# Patient Record
Sex: Male | Born: 1950 | Race: White | Hispanic: No | Marital: Married | Smoking: Former smoker
Health system: Southern US, Community
[De-identification: ages and names within clinical notes are randomized; demographics above are authoritative.]

---

## 2014-03-22 ENCOUNTER — Other Ambulatory Visit: Payer: Self-pay | Admitting: Orthopedic Surgery

## 2014-03-22 DIAGNOSIS — M545 Low back pain, unspecified: Secondary | ICD-10-CM

## 2014-03-22 DIAGNOSIS — M542 Cervicalgia: Secondary | ICD-10-CM

## 2014-04-23 ENCOUNTER — Other Ambulatory Visit: Payer: Self-pay | Admitting: Orthopedic Surgery

## 2014-04-23 DIAGNOSIS — M545 Low back pain, unspecified: Secondary | ICD-10-CM

## 2014-04-23 DIAGNOSIS — M542 Cervicalgia: Secondary | ICD-10-CM

## 2014-04-26 ENCOUNTER — Ambulatory Visit
Admission: RE | Admit: 2014-04-26 | Discharge: 2014-04-26 | Disposition: A | Discharge status: Home / Self Care | Payer: Medicare PPO | Source: Ambulatory Visit | Attending: Orthopedic Surgery | Admitting: Orthopedic Surgery

## 2014-04-26 DIAGNOSIS — M545 Low back pain, unspecified: Secondary | ICD-10-CM

## 2014-04-26 DIAGNOSIS — M542 Cervicalgia: Secondary | ICD-10-CM

## 2014-04-26 MED ORDER — TRIAMCINOLONE ACETONIDE 40 MG/ML IJ SUSP (RADIOLOGY)
60.0000 mg | Freq: Once | INTRAMUSCULAR | Status: AC
  Administered 2014-04-26: 60 mg via EPIDURAL

## 2014-04-26 MED ORDER — DIAZEPAM 5 MG PO TABS
10.0000 mg | ORAL_TABLET | Freq: Once | ORAL | Status: AC
  Administered 2014-04-26: 10 mg via ORAL

## 2014-04-26 MED ORDER — IOHEXOL 300 MG/ML  SOLN
1.0000 mL | Freq: Once | INTRAMUSCULAR | Status: AC | PRN
  Administered 2014-04-26: 1 mL via EPIDURAL

## 2014-04-26 NOTE — Discharge Instructions (Signed)

## 2014-05-10 ENCOUNTER — Other Ambulatory Visit: Payer: Self-pay

## 2014-05-13 ENCOUNTER — Other Ambulatory Visit: Payer: Self-pay | Admitting: Orthopedic Surgery

## 2014-05-13 ENCOUNTER — Ambulatory Visit
Admission: RE | Admit: 2014-05-13 | Discharge: 2014-05-13 | Disposition: A | Discharge status: Home / Self Care | Payer: Medicare PPO | Source: Ambulatory Visit | Attending: Orthopedic Surgery | Admitting: Orthopedic Surgery

## 2014-05-13 DIAGNOSIS — M545 Low back pain, unspecified: Secondary | ICD-10-CM

## 2014-05-13 DIAGNOSIS — M542 Cervicalgia: Secondary | ICD-10-CM

## 2014-05-13 IMAGING — XA DG EPIDURAL/NERVE ROOT
1 series · 2 of 2 positions shown · non-contrast
Comparison: none

CLINICAL DATA: Lumbosacral spondylosis without myelopathy with
radiculopathy. Low back and left leg pain. Clinical concern for left
L4 or L5 radiculopathy. Prior laminectomies at L4-5 preclude
interlaminar injection, with postsurgical changes partially
involving L3 as well. Given these postoperative changes and left
radicular symptoms, a left L4-5 transforaminal injection will be
performed.

[Series 2: ortho standard · 2 of 2 slices shown]
[im 1/2]
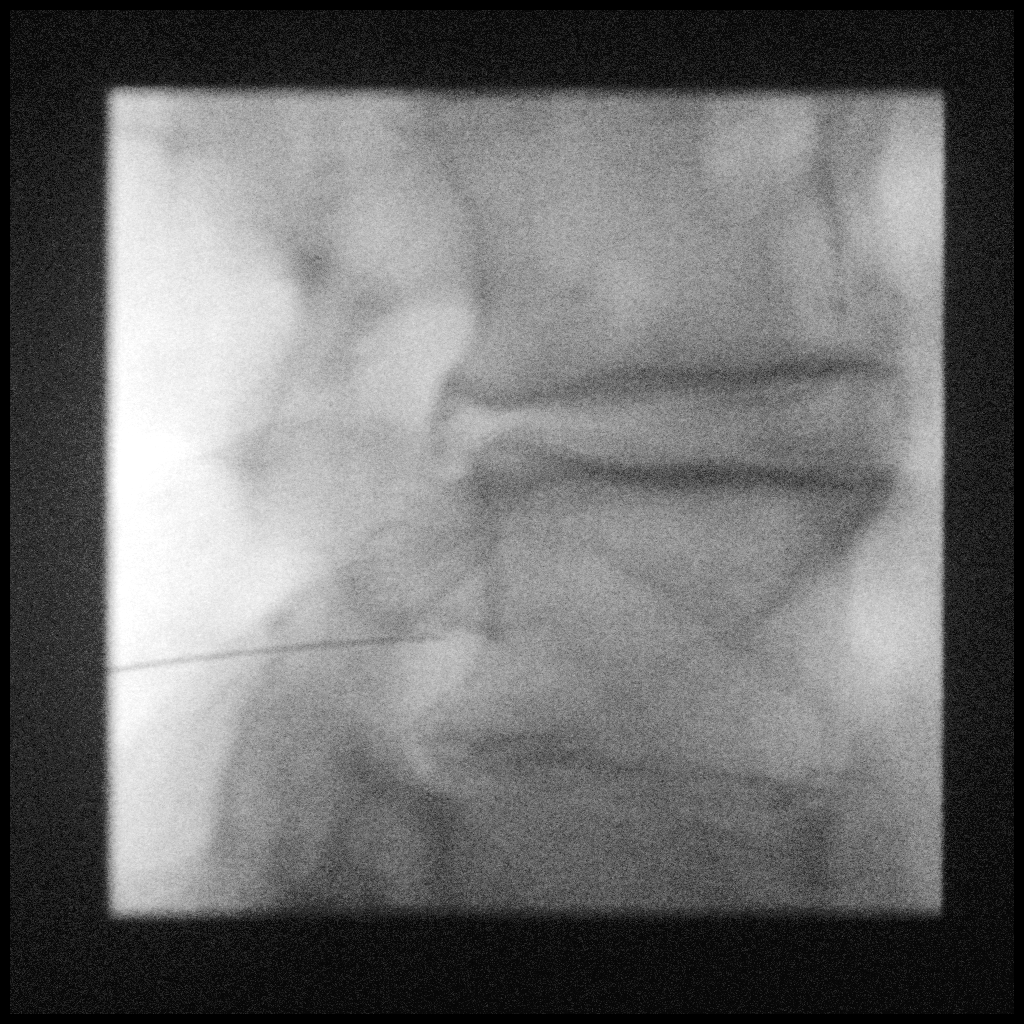
[im 2/2]
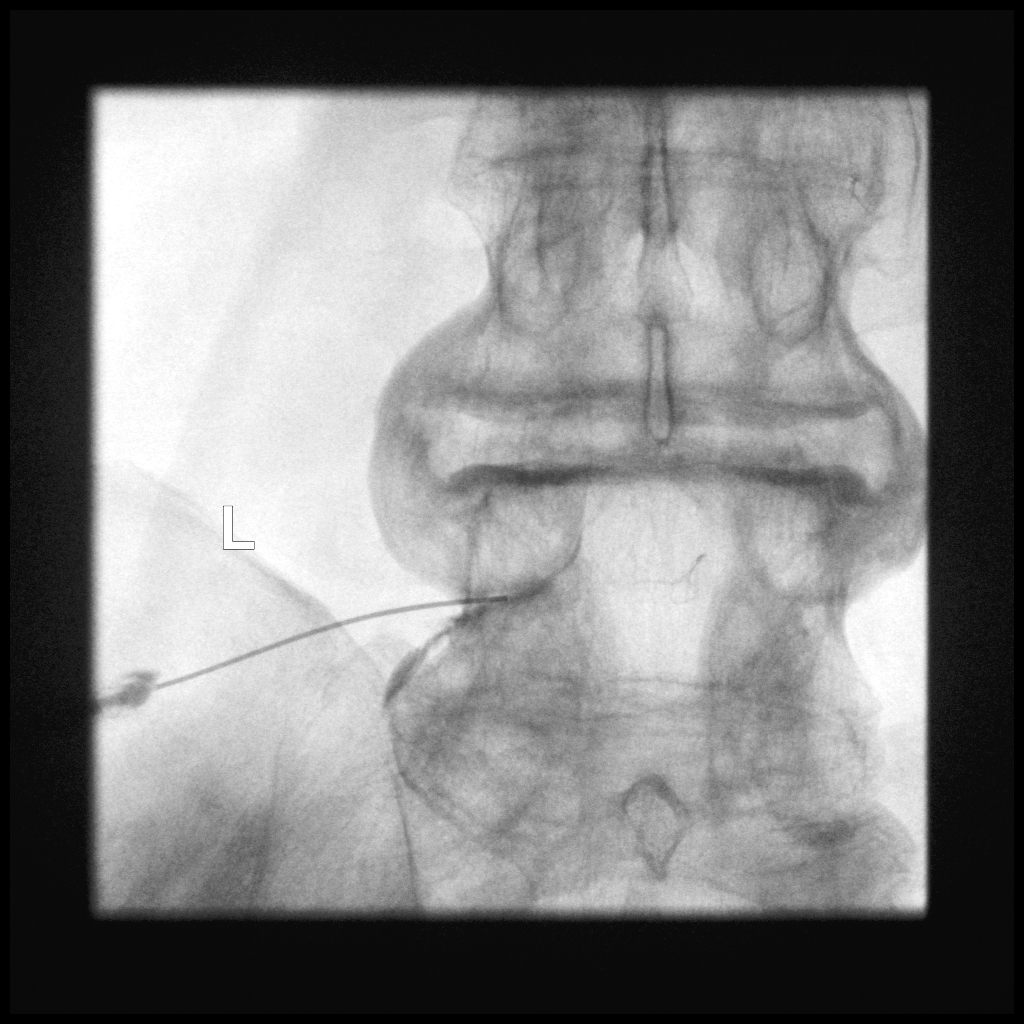

[2 of 2 positions shown; findings below may reference images not displayed]

EXAM:
EPIDURAL/NERVE ROOT

FLUOROSCOPY TIME:  0 min 49 seconds

117.30 microGray*m2

PROCEDURE:
The procedure, risks, benefits, and alternatives were explained to
the patient. Questions regarding the procedure were encouraged and
answered. The patient understands and consents to the procedure.

Left L4 NERVE ROOT BLOCK AND TRANSFORAMINAL EPIDURAL: A posterior
oblique approach was taken to the intervertebral foramen on the left
at L4-5 using a curved 22 gauge spinal needle. Injection of
Omnipaque 180 outlined the left L4 nerve root and showed good
epidural spread. No vascular opacification is seen. 120 mg of
Depo-Medrol mixed with 1.5 mL 1% lidocaine were instilled. The
procedure was well-tolerated, and the patient was discharged thirty
minutes following the injection in good condition.

COMPLICATIONS:
None
IMPRESSION: Technically successful injection consisting of a left L4 nerve root
block and transforaminal epidural.

## 2014-05-13 MED ORDER — DIAZEPAM 5 MG PO TABS
10.0000 mg | ORAL_TABLET | Freq: Once | ORAL | Status: AC
  Administered 2014-05-13: 10 mg via ORAL

## 2014-05-13 MED ORDER — IOHEXOL 180 MG/ML  SOLN
1.0000 mL | Freq: Once | INTRAMUSCULAR | Status: AC | PRN
  Administered 2014-05-13: 1 mL via EPIDURAL

## 2014-05-13 MED ORDER — METHYLPREDNISOLONE ACETATE 40 MG/ML INJ SUSP (RADIOLOG
120.0000 mg | Freq: Once | INTRAMUSCULAR | Status: AC
  Administered 2014-05-13: 120 mg via EPIDURAL
